# Patient Record
Sex: Male | Born: 1995 | Race: White | Hispanic: No | Marital: Single | Smoking: Never smoker
Health system: Southern US, Community
[De-identification: ages and names within clinical notes are randomized; demographics above are authoritative.]

## PROBLEM LIST (undated history)

## (undated) DIAGNOSIS — J45909 Unspecified asthma, uncomplicated: Secondary | ICD-10-CM

## (undated) HISTORY — DX: Unspecified asthma, uncomplicated: J45.909

---
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
## 2022-05-05 ENCOUNTER — Encounter: Payer: Self-pay | Admitting: Medical

## 2022-05-05 ENCOUNTER — Ambulatory Visit: Payer: Self-pay | Admitting: Medical

## 2022-05-05 VITALS — BP 112/82 | HR 82 | Temp 97.0°F | Resp 16

## 2022-05-05 DIAGNOSIS — R051 Acute cough: Secondary | ICD-10-CM

## 2022-05-05 DIAGNOSIS — J988 Other specified respiratory disorders: Secondary | ICD-10-CM

## 2022-05-05 MED ORDER — BENZONATATE 100 MG PO CAPS: 100.0000 mg | ORAL_CAPSULE | Freq: Two times a day (BID) | ORAL | 0 refills | Status: DC | PRN

## 2022-05-05 MED ORDER — AMOXICILLIN-POT CLAVULANATE 875-125 MG PO TABS: 1.0000 | ORAL_TABLET | Freq: Two times a day (BID) | ORAL | 0 refills | Status: DC

## 2022-05-05 MED ORDER — ALBUTEROL SULFATE HFA 108 (90 BASE) MCG/ACT IN AERS: 2.0000 | INHALATION_SPRAY | Freq: Four times a day (QID) | RESPIRATORY_TRACT | 2 refills | Status: DC | PRN

## 2022-05-05 NOTE — Progress Notes (Signed)
   Subjective:    Patient ID: Max Blankenship, male    DOB: 1996-07-17, 26 y.o.   MRN: [id number]  HPI  26 yo male in non acute distress , presents today with productive yellow cough. Denies fever or chill or SOB or CP.   Review of Systems  Respiratory:  Positive for cough.        Objective:   Physical Exam Vitals and nursing note reviewed.  Constitutional:      Appearance: Normal appearance.  HENT:     Head: Normocephalic and atraumatic.     Right Ear: Tympanic membrane, ear canal and external ear normal.     Left Ear: Tympanic membrane, ear canal and external ear normal.     Mouth/Throat:     Mouth: Mucous membranes are moist.     Pharynx: Oropharynx is clear.  Eyes:     Extraocular Movements: Extraocular movements intact.     Conjunctiva/sclera: Conjunctivae normal.     Pupils: Pupils are equal, round, and reactive to light.  Cardiovascular:     Rate and Rhythm: Normal rate and regular rhythm.     Pulses: Normal pulses.     Heart sounds: Normal heart sounds.  Pulmonary:     Effort: Pulmonary effort is normal.     Breath sounds: Wheezing (upper airway) present.  Musculoskeletal:        General: Normal range of motion.     Cervical back: Normal range of motion and neck supple.  Skin:    General: Skin is warm and dry.     Capillary Refill: Capillary refill takes less than 2 seconds.  Neurological:     General: No focal deficit present.     Mental Status: He is alert and oriented to person, place, and time.  Psychiatric:        Mood and Affect: Mood normal.        Behavior: Behavior normal.        Thought Content: Thought content normal.        Judgment: Judgment normal.           Assessment & Plan:   Encounter Diagnoses  Name Primary?   Bacterial respiratory infection Yes   Acute cough    Meds ordered this encounter  Medications   amoxicillin-clavulanate (AUGMENTIN) 875-125 MG tablet    Sig: Take 1 tablet by mouth 2 (two) times daily.    Dispense:   20 tablet    Refill:  0   albuterol (VENTOLIN HFA) 108 (90 Base) MCG/ACT inhaler    Sig: Inhale 2 puffs into the lungs every 6 (six) hours as needed for wheezing or shortness of breath.    Dispense:  8 g    Refill:  2   benzonatate (TESSALON) 100 MG capsule    Sig: Take 1 capsule (100 mg total) by mouth 2 (two) times daily as needed for cough.    Dispense:  30 capsule    Refill:  0   To rinse mouth after using Advair. Return in 3-5 days if not improving. Patient verbalizes understanding and has no questions at discharge.

## 2022-05-05 NOTE — Patient Instructions (Signed)
Cough, Adult A cough helps to clear your throat and lungs. A cough may be a sign of an illness or another medical condition. An acute cough may only last 2-3 weeks, while a chronic cough may last 8 or more weeks. Many things can cause a cough. They include: Germs (viruses or bacteria) that attack the airway. Breathing in things that bother (irritate) your lungs. Allergies. Asthma. Mucus that runs down the back of your throat (postnasal drip). Smoking. Acid backing up from the stomach into the tube that moves food from the mouth to the stomach (gastroesophageal reflux). Some medicines. Lung problems. Other medical conditions, such as heart failure or a blood clot in the lung (pulmonary embolism). Follow these instructions at home: Medicines Take over-the-counter and prescription medicines only as told by your doctor. Talk with your doctor before you take medicines that stop a cough (cough suppressants). Lifestyle  Do not smoke, and try not to be around smoke. Do not use any products that contain nicotine or tobacco, such as cigarettes, e-cigarettes, and chewing tobacco. If you need help quitting, ask your doctor. Drink enough fluid to keep your pee (urine) pale yellow. Avoid caffeine. Do not drink alcohol if your doctor tells you not to drink. General instructions  Watch for any changes in your cough. Tell your doctor about them. Always cover your mouth when you cough. Stay away from things that make you cough, such as perfume, candles, campfire smoke, or cleaning products. If the air is dry, use a cool mist vaporizer or humidifier in your home. If your cough is worse at night, try using extra pillows to raise your head up higher while you sleep. Rest as needed. Keep all follow-up visits as told by your doctor. This is important. Contact a doctor if: You have new symptoms. You cough up pus. Your cough does not get better after 2-3 weeks, or your cough gets worse. Cough medicine  does not help your cough and you are not sleeping well. You have pain that gets worse or pain that is not helped with medicine. You have a fever. You are losing weight and you do not know why. You have night sweats. Get help right away if: You cough up blood. You have trouble breathing. Your heartbeat is very fast. These symptoms may be an emergency. Do not wait to see if the symptoms will go away. Get medical help right away. Call your local emergency services (911 in the [place] Do not drive yourself to the hospital. Summary A cough helps to clear your throat and lungs. Many things can cause a cough. Take over-the-counter and prescription medicines only as told by your doctor. Always cover your mouth when you cough. Contact a doctor if you have new symptoms or you have a cough that does not get better or gets worse. This information is not intended to replace advice given to you by your health care provider. Make sure you discuss any questions you have with your health care provider. Document Revised: 12/02/2019 Document Reviewed: 11/01/2018 Elsevier Patient Education  2023 Elsevier Inc. Upper Respiratory Infection, Adult An upper respiratory infection (URI) affects the nose, throat, and upper airways that lead to the lungs. The most common type of URI is often called the common cold. URIs usually get better on their own, without medical treatment. What are the causes? A URI is caused by a germ (virus). You may catch these germs by: Breathing in droplets from an infected person's cough or sneeze. Touching something that  has the germ on it (is contaminated) and then touching your mouth, nose, or eyes. What increases the risk? You are more likely to get a URI if: You are very young or very old. You have close contact with others, such as at work, school, or a health care facility. You smoke. You have long-term (chronic) heart or lung disease. You have a weakened disease-fighting system  (immune system). You have nasal allergies or asthma. You have a lot of stress. You have poor nutrition. What are the signs or symptoms? Runny or stuffy (congested) nose. Cough. Sneezing. Sore throat. Headache. Feeling tired (fatigue). Fever. Not wanting to eat as much as usual. Pain in your forehead, behind your eyes, and over your cheekbones (sinus pain). Muscle aches. Redness or irritation of the eyes. Pressure in the ears or face. How is this treated? URIs usually get better on their own within 7-10 days. Medicines cannot cure URIs, but your doctor may recommend certain medicines to help relieve symptoms, such as: Over-the-counter cold medicines. Medicines to reduce coughing (cough suppressants). Coughing is a type of defense against infection that helps to clear the nose, throat, windpipe, and lungs (respiratory system). Take these medicines only as told by your doctor. Medicines to lower your fever. Follow these instructions at home: Activity Rest as needed. If you have a fever, stay home from work or school until your fever is gone, or until your doctor says you may return to work or school. You should stay home until you cannot spread the infection anymore (you are not contagious). Your doctor may have you wear a face mask so you have less risk of spreading the infection. Relieving symptoms Rinse your mouth often with salt water. To make salt water, dissolve -1 tsp (3-6 g) of salt in 1 cup (237 mL) of warm water. Use a cool-mist humidifier to add moisture to the air. This can help you breathe more easily. Eating and drinking  Drink enough fluid to keep your pee (urine) pale yellow. Eat soups and other clear broths. General instructions  Take over-the-counter and prescription medicines only as told by your doctor. Do not smoke or use any products that contain nicotine or tobacco. If you need help quitting, ask your doctor. Avoid being where people are smoking (avoid  secondhand smoke). Stay up to date on all your shots (immunizations), and get the flu shot every year. Keep all follow-up visits. How to prevent the spread of infection to others  Wash your hands with soap and water for at least 20 seconds. If you cannot use soap and water, use hand sanitizer. Avoid touching your mouth, face, eyes, or nose. Cough or sneeze into a tissue or your sleeve or elbow. Do not cough or sneeze into your hand or into the air. Contact a doctor if: You are getting worse, not better. You have any of these: A fever or chills. Brown or red mucus in your nose. Yellow or brown fluid (discharge)coming from your nose. Pain in your face, especially when you bend forward. Swollen neck glands. Pain when you swallow. White areas in the back of your throat. Get help right away if: You have shortness of breath that gets worse. You have very bad or constant: Headache. Ear pain. Pain in your forehead, behind your eyes, and over your cheekbones (sinus pain). Chest pain. You have long-lasting (chronic) lung disease along with any of these: Making high-pitched whistling sounds when you breathe, most often when you breathe out (wheezing). Long-lasting cough (more   than 14 days). Coughing up blood. A change in your usual mucus. You have a stiff neck. You have changes in your: Vision. Hearing. Thinking. Mood. These symptoms may be an emergency. Get help right away. Call 911. Do not wait to see if the symptoms will go away. Do not drive yourself to the hospital. Summary An upper respiratory infection (URI) is caused by a germ (virus). The most common type of URI is often called the common cold. URIs usually get better within 7-10 days. Take over-the-counter and prescription medicines only as told by your doctor. This information is not intended to replace advice given to you by your health care provider. Make sure you discuss any questions you have with your health care  provider. Document Revised: 05/15/2021 Document Reviewed: 05/15/2021 Elsevier Patient Education  2023 [employer].

## 2022-12-30 ENCOUNTER — Ambulatory Visit: Payer: BC Managed Care – PPO | Admitting: Family Medicine

## 2022-12-30 ENCOUNTER — Encounter: Payer: Self-pay | Admitting: Family Medicine

## 2022-12-30 VITALS — BP 100/78 | HR 78 | Ht 69.0 in | Wt 198.4 lb

## 2022-12-30 DIAGNOSIS — Z23 Encounter for immunization: Secondary | ICD-10-CM | POA: Diagnosis not present

## 2022-12-30 DIAGNOSIS — J45909 Unspecified asthma, uncomplicated: Secondary | ICD-10-CM | POA: Diagnosis not present

## 2022-12-30 DIAGNOSIS — Z7689 Persons encountering health services in other specified circumstances: Secondary | ICD-10-CM | POA: Diagnosis not present

## 2022-12-30 NOTE — Assessment & Plan Note (Signed)
Tdap administered today.

## 2022-12-30 NOTE — Progress Notes (Signed)
     Primary Care / Sports Medicine Office Visit  Patient Information:  Patient ID: Max Blankenship, male DOB: Apr 21, 1996 Age: 27 y.o. MRN: [id number]   Max Blankenship is a pleasant 27 y.o. male presenting with the following:  Chief Complaint  Patient presents with   Establish Care    Vitals:   12/30/22 1333  BP: 100/78  Pulse: 78  SpO2: 98%   Vitals:   12/30/22 1333  Weight: 198 lb 6.4 oz (90 kg)  Height: '5\' 9"'$  (1.753 m)   Body mass index is 29.3 kg/m.  No results found.   Independent interpretation of notes and tests performed by another provider:   None  Procedures performed:   None  Pertinent History, Exam, Impression, and Recommendations:   Max Blankenship was seen today for establish care.  Need for Tdap vaccination Assessment & Plan: Tdap administered today.  Orders: -     Tdap vaccine greater than or equal to 7yo IM  Encounter to establish care with new doctor Assessment & Plan: Establishing care, no active complaints.  Pertinent past medical, surgical, and family histories reviewed.  Examination with positive S1-S2, regular rate and rhythm, no additional heart sounds, clear lung fields throughout without wheezes, rales, rhonchi, abdomen soft, nontender, nondistended, normoactive bowel sounds, no hepatosplenomegaly.  At this stage in addition to discussion about general health we will coordinate follow-up where we will obtain risk stratification of labs as part of his annual physical.   Uncomplicated asthma, unspecified asthma severity, unspecified whether persistent Overview: -  pfts 12/04/14 FEV1 3.24 (75%) ratio 75 and no sign resp to saba/ nl dlco - Spirometry 12/07/14  3.31 (86%) Ratio 78  -  CPST  12/21/14 >nl with neg EIA  Assessment & Plan: Excellent symptom control, no exacerbations for several years.    I provided a total time of 46 minutes including both face-to-face and non-face-to-face time on 12/30/2022 inclusive of time utilized for  medical chart review, information gathering, care coordination with staff, and documentation completion.   Orders & Medications No orders of the defined types were placed in this encounter.  Orders Placed This Encounter  Procedures   Tdap vaccine greater than or equal to 7yo IM     Return in about 4 weeks (around 01/27/2023) for CPE.     Montel Culver, MD, [facility]   Primary Care Sports Medicine Primary Care and Sports Medicine at [facility]

## 2022-12-30 NOTE — Patient Instructions (Signed)
-   Return for annual physical

## 2022-12-30 NOTE — Assessment & Plan Note (Signed)
Establishing care, no active complaints.  Pertinent past medical, surgical, and family histories reviewed.  Examination with positive S1-S2, regular rate and rhythm, no additional heart sounds, clear lung fields throughout without wheezes, rales, rhonchi, abdomen soft, nontender, nondistended, normoactive bowel sounds, no hepatosplenomegaly.  At this stage in addition to discussion about general health we will coordinate follow-up where we will obtain risk stratification of labs as part of his annual physical.

## 2022-12-30 NOTE — Assessment & Plan Note (Signed)
Excellent symptom control, no exacerbations for several years.

## 2023-01-27 ENCOUNTER — Encounter: Payer: Self-pay | Admitting: Family Medicine

## 2023-01-27 ENCOUNTER — Ambulatory Visit (INDEPENDENT_AMBULATORY_CARE_PROVIDER_SITE_OTHER): Payer: BC Managed Care – PPO | Admitting: Family Medicine

## 2023-01-27 VITALS — BP 122/82 | HR 80 | Ht 69.0 in | Wt 197.0 lb

## 2023-01-27 DIAGNOSIS — J4520 Mild intermittent asthma, uncomplicated: Secondary | ICD-10-CM

## 2023-01-27 DIAGNOSIS — E559 Vitamin D deficiency, unspecified: Secondary | ICD-10-CM

## 2023-01-27 DIAGNOSIS — Z1159 Encounter for screening for other viral diseases: Secondary | ICD-10-CM

## 2023-01-27 DIAGNOSIS — Z114 Encounter for screening for human immunodeficiency virus [HIV]: Secondary | ICD-10-CM

## 2023-01-27 DIAGNOSIS — Z0000 Encounter for general adult medical examination without abnormal findings: Secondary | ICD-10-CM | POA: Diagnosis not present

## 2023-01-27 DIAGNOSIS — Z13220 Encounter for screening for lipoid disorders: Secondary | ICD-10-CM

## 2023-01-27 DIAGNOSIS — J3089 Other allergic rhinitis: Secondary | ICD-10-CM

## 2023-01-27 DIAGNOSIS — J309 Allergic rhinitis, unspecified: Secondary | ICD-10-CM | POA: Insufficient documentation

## 2023-01-27 NOTE — Progress Notes (Signed)
Annual Physical Exam Visit  Patient Information:  Patient ID: Max Blankenship, male DOB: 1996/05/26 Age: 27 y.o. MRN: [id number]   Subjective:   CC: Annual Physical Exam  HPI:  Max Blankenship is here for their annual physical.  I reviewed the past medical history, family history, social history, surgical history, and allergies today and changes were made as necessary.  Please see the problem list section below for additional details.  Past Medical History: Past Medical History:  Diagnosis Date   Asthma 2007   well controlled   Past Surgical History: Past Surgical History:  Procedure Laterality Date   LUMBAR MICRODISCECTOMY  2017   Progressive back pain, did not respond to PT, 2 levels   Family History: Family History  Problem Relation Age of Onset   Anxiety disorder Mother    Depression Mother    Hypertension Father 79 - 74   Liver disease Brother 59       Fatty LIver   Diabetes Maternal Grandmother    Pancreatic cancer Maternal Grandmother    Diabetes Maternal Grandfather    Heart attack Maternal Grandfather    Diabetes Paternal Grandmother    Diabetes Paternal Grandfather    Allergies: Allergies  Allergen Reactions   Naproxen Sodium Hives and Swelling   Health Maintenance: Health Maintenance  Topic Date Due   COVID-19 Vaccine (1) Never done   HPV VACCINES (1 - Male 2-dose series) Never done   HIV Screening  Never done   Hepatitis C Screening  Never done   INFLUENZA VACCINE  05/28/2023   DTaP/Tdap/Td (2 - Td or Tdap) 12/29/2032    HM Colonoscopy     This patient has no relevant Health Maintenance data.      Medications: No current outpatient medications on file prior to visit.   No current facility-administered medications on file prior to visit.    Review of Systems: No headache, visual changes, nausea, vomiting, diarrhea, constipation, dizziness, abdominal pain, skin rash, fevers, chills, night sweats, swollen lymph nodes, weight loss,  chest pain, body aches, joint swelling, muscle aches, shortness of breath, mood changes, visual or auditory hallucinations reported.  Objective:   Vitals:   01/27/23 1327  BP: 122/82  Pulse: 80  SpO2: 97%   Vitals:   01/27/23 1327  Weight: 197 lb (89.4 kg)  Height: 5\' 9"  (1.753 m)   Body mass index is 29.09 kg/m.  General: Well Developed, well nourished, and in no acute distress.  Neuro: Alert and oriented x3, extra-ocular muscles intact, sensation grossly intact. Cranial nerves II through XII are grossly intact, motor, sensory, and coordinative functions are intact. HEENT: Normocephalic, atraumatic, pupils equal round reactive to light, neck supple, no masses, no lymphadenopathy, thyroid nonpalpable. Oropharynx, nasopharynx with mildly erythematous turbinates bilaterally, external ear canals are unremarkable. Skin: Warm and dry, no rashes noted.  Cardiac: Regular rate and rhythm, no murmurs rubs or gallops. No peripheral edema. Pulses symmetric. Respiratory: Clear to auscultation bilaterally. Not using accessory muscles, speaking in full sentences.  Abdominal: Soft, nontender, nondistended, positive bowel sounds, no masses, no organomegaly. Musculoskeletal: Shoulder, elbow, wrist, hip, knee, ankle stable, and with full range of motion.    Impression and Recommendations:   The patient was counselled, risk factors were discussed, and anticipatory guidance given.  Problem List Items Addressed This Visit       Respiratory   Allergic rhinitis due to allergen    Noted seasonally, currently somewhat controlled with sporadic usage of Claritin 24-hour.  -  Dose Claritin consistently throughout the allergy season - Did recommend alternative or adjunct Flonase      Asthma    Asymptomatic.        Other   Annual physical exam - Primary    Annual examination completed, risk stratification labs ordered, anticipatory guidance provided.  We will follow labs once resulted.       Relevant Orders   CBC   Comprehensive metabolic panel   Hepatitis C antibody   HIV Antibody (routine testing w rflx)   Lipid panel   TSH   VITAMIN D 25 Hydroxy (Vit-D Deficiency, Fractures)   Other Visit Diagnoses     Screening for HIV (human immunodeficiency virus)       Relevant Orders   HIV Antibody (routine testing w rflx)   Need for hepatitis C screening test       Relevant Orders   Hepatitis C antibody   Screening for lipoid disorders       Relevant Orders   Lipid panel   Vitamin D deficiency       Relevant Orders   VITAMIN D 25 Hydroxy (Vit-D Deficiency, Fractures)   Healthcare maintenance       Relevant Orders   CBC   Comprehensive metabolic panel   Hepatitis C antibody   HIV Antibody (routine testing w rflx)   Lipid panel   TSH   VITAMIN D 25 Hydroxy (Vit-D Deficiency, Fractures)        Orders & Medications Medications: No orders of the defined types were placed in this encounter.  Orders Placed This Encounter  Procedures   CBC   Comprehensive metabolic panel   Hepatitis C antibody   HIV Antibody (routine testing w rflx)   Lipid panel   TSH   VITAMIN D 25 Hydroxy (Vit-D Deficiency, Fractures)     Return in about 1 year (around 01/27/2024) for CPE.    Montel Culver, MD, [facility]   Primary Care Sports Medicine Primary Care and Sports Medicine at [facility]

## 2023-01-27 NOTE — Patient Instructions (Signed)
-   Obtain fasting labs with orders provided (can have water or black coffee but otherwise no food or drink x 8 hours before labs) °- Review information provided °- Attend eye doctor annually, dentist every 6 months, work towards or maintain 30 minutes of moderate intensity physical activity at least 5 days per week, and consume a balanced diet °- Return in 1 year for physical °- Contact us for any questions between now and then °

## 2023-01-27 NOTE — Assessment & Plan Note (Signed)
Annual examination completed, risk stratification labs ordered, anticipatory guidance provided.  We will follow labs once resulted. 

## 2023-01-27 NOTE — Addendum Note (Signed)
Addended by: Montel Culver on: 01/27/2023 02:08 PM   Modules accepted: Orders

## 2023-01-27 NOTE — Assessment & Plan Note (Signed)
Asymptomatic. 

## 2023-01-27 NOTE — Assessment & Plan Note (Signed)
Noted seasonally, currently somewhat controlled with sporadic usage of Claritin 24-hour.  - Dose Claritin consistently throughout the allergy season - Did recommend alternative or adjunct Flonase

## 2023-01-28 LAB — COMPREHENSIVE METABOLIC PANEL
ALT: 215 IU/L — ABNORMAL HIGH (ref 0–44)
AST: 70 IU/L — ABNORMAL HIGH (ref 0–40)
Albumin/Globulin Ratio: 1.7 (ref 1.2–2.2)
Albumin: 4.6 g/dL (ref 4.3–5.2)
Alkaline Phosphatase: 69 IU/L (ref 44–121)
BUN/Creatinine Ratio: 9 (ref 9–20)
BUN: 9 mg/dL (ref 6–20)
Bilirubin Total: 0.6 mg/dL (ref 0.0–1.2)
CO2: 25 mmol/L (ref 20–29)
Calcium: 9.8 mg/dL (ref 8.7–10.2)
Chloride: 102 mmol/L (ref 96–106)
Creatinine, Ser: 1.03 mg/dL (ref 0.76–1.27)
Globulin, Total: 2.7 g/dL (ref 1.5–4.5)
Glucose: 74 mg/dL (ref 70–99)
Potassium: 4.6 mmol/L (ref 3.5–5.2)
Sodium: 139 mmol/L (ref 134–144)
Total Protein: 7.3 g/dL (ref 6.0–8.5)
eGFR: 103 mL/min/{1.73_m2} (ref >59)

## 2023-01-28 LAB — HEPATITIS C ANTIBODY: Hep C Virus Ab: NONREACTIVE

## 2023-01-28 LAB — APO A1 + B + RATIO
Apolipo. B/A-1 Ratio: 1 ratio — ABNORMAL HIGH (ref 0.0–0.7)
Apolipoprotein A-1: 129 mg/dL (ref 101–178)
Apolipoprotein B: 126 mg/dL — ABNORMAL HIGH (ref <90)

## 2023-01-28 LAB — LIPID PANEL
Chol/HDL Ratio: 6 ratio — ABNORMAL HIGH (ref 0.0–5.0)
Cholesterol, Total: 254 mg/dL — ABNORMAL HIGH (ref 100–199)
HDL: 42 mg/dL (ref >39)
LDL Chol Calc (NIH): 173 mg/dL — ABNORMAL HIGH (ref 0–99)
Triglycerides: 210 mg/dL — ABNORMAL HIGH (ref 0–149)
VLDL Cholesterol Cal: 39 mg/dL (ref 5–40)

## 2023-01-28 LAB — HIV ANTIBODY (ROUTINE TESTING W REFLEX): HIV Screen 4th Generation wRfx: NONREACTIVE

## 2023-01-28 LAB — CBC
Hematocrit: 43.6 % (ref 37.5–51.0)
Hemoglobin: 15.2 g/dL (ref 13.0–17.7)
MCH: 31.9 pg (ref 26.6–33.0)
MCHC: 34.9 g/dL (ref 31.5–35.7)
MCV: 91 fL (ref 79–97)
Platelets: 229 10*3/uL (ref 150–450)
RBC: 4.77 x10E6/uL (ref 4.14–5.80)
RDW: 11.9 % (ref 11.6–15.4)
WBC: 9.8 10*3/uL (ref 3.4–10.8)

## 2023-01-28 LAB — VITAMIN D 25 HYDROXY (VIT D DEFICIENCY, FRACTURES): Vit D, 25-Hydroxy: 14.6 ng/mL — ABNORMAL LOW (ref 30.0–100.0)

## 2023-01-28 LAB — TSH: TSH: 1.21 u[IU]/mL (ref 0.450–4.500)

## 2023-02-03 ENCOUNTER — Other Ambulatory Visit: Payer: Self-pay | Admitting: Family Medicine

## 2023-02-03 ENCOUNTER — Encounter: Payer: Self-pay | Admitting: Family Medicine

## 2023-02-03 MED ORDER — VITAMIN D (ERGOCALCIFEROL) 1.25 MG (50000 UNIT) PO CAPS: 50000.0000 [IU] | ORAL_CAPSULE | ORAL | 0 refills | Status: DC

## 2023-02-06 ENCOUNTER — Other Ambulatory Visit: Payer: Self-pay

## 2023-02-06 DIAGNOSIS — Z13220 Encounter for screening for lipoid disorders: Secondary | ICD-10-CM

## 2023-02-06 NOTE — Progress Notes (Signed)
Labs order, pt appointment made for video follow up labs

## 2023-05-08 ENCOUNTER — Encounter: Payer: Self-pay | Admitting: Family Medicine

## 2023-05-08 ENCOUNTER — Telehealth (INDEPENDENT_AMBULATORY_CARE_PROVIDER_SITE_OTHER): Payer: BC Managed Care – PPO | Admitting: Family Medicine

## 2023-05-08 DIAGNOSIS — E782 Mixed hyperlipidemia: Secondary | ICD-10-CM | POA: Diagnosis not present

## 2023-05-08 LAB — COMPREHENSIVE METABOLIC PANEL
ALT: 215 IU/L — ABNORMAL HIGH (ref 0–44)
AST: 84 IU/L — ABNORMAL HIGH (ref 0–40)
Albumin: 4.7 g/dL (ref 4.3–5.2)
Alkaline Phosphatase: 68 IU/L (ref 44–121)
BUN/Creatinine Ratio: 7 — ABNORMAL LOW (ref 9–20)
BUN: 8 mg/dL (ref 6–20)
Bilirubin Total: 1 mg/dL (ref 0.0–1.2)
CO2: 23 mmol/L (ref 20–29)
Calcium: 10 mg/dL (ref 8.7–10.2)
Chloride: 99 mmol/L (ref 96–106)
Creatinine, Ser: 1.1 mg/dL (ref 0.76–1.27)
Globulin, Total: 2.4 g/dL (ref 1.5–4.5)
Glucose: 98 mg/dL (ref 70–99)
Potassium: 4.2 mmol/L (ref 3.5–5.2)
Sodium: 138 mmol/L (ref 134–144)
Total Protein: 7.1 g/dL (ref 6.0–8.5)
eGFR: 95 mL/min/{1.73_m2} (ref >59)

## 2023-05-08 LAB — LIPID PANEL
Chol/HDL Ratio: 7.7 ratio — ABNORMAL HIGH (ref 0.0–5.0)
Cholesterol, Total: 277 mg/dL — ABNORMAL HIGH (ref 100–199)
HDL: 36 mg/dL — ABNORMAL LOW (ref >39)
LDL Chol Calc (NIH): 207 mg/dL — ABNORMAL HIGH (ref 0–99)
Triglycerides: 175 mg/dL — ABNORMAL HIGH (ref 0–149)
VLDL Cholesterol Cal: 34 mg/dL (ref 5–40)

## 2023-05-08 LAB — APO A1 + B + RATIO
Apolipo. B/A-1 Ratio: 1.5 ratio — ABNORMAL HIGH (ref 0.0–0.7)
Apolipoprotein A-1: 104 mg/dL (ref 101–178)
Apolipoprotein B: 153 mg/dL — ABNORMAL HIGH (ref <90)

## 2023-05-08 MED ORDER — PITAVASTATIN CALCIUM 2 MG PO TABS
2.0000 mg | ORAL_TABLET | Freq: Every evening | ORAL | 2 refills | Status: AC
Start: 2023-05-08 — End: ?

## 2023-05-08 NOTE — Telephone Encounter (Signed)
l °

## 2023-05-08 NOTE — Telephone Encounter (Signed)
FYI

## 2023-05-09 LAB — SPECIMEN STATUS REPORT

## 2023-05-09 LAB — VITAMIN D 25 HYDROXY (VIT D DEFICIENCY, FRACTURES): Vit D, 25-Hydroxy: 29.3 ng/mL — ABNORMAL LOW (ref 30.0–100.0)

## 2023-05-11 DIAGNOSIS — E782 Mixed hyperlipidemia: Secondary | ICD-10-CM | POA: Insufficient documentation

## 2023-05-11 NOTE — Progress Notes (Signed)
Primary Care / Sports Medicine Virtual Visit  Patient Information:  Patient ID: Max Blankenship, male DOB: Oct 06, 1996 Age: 27 y.o. MRN: [id number]   Max Blankenship is a pleasant 28 y.o. male presenting with the following:  Chief Complaint  Patient presents with   lab follow up    Review of Systems: No fevers, chills, night sweats, weight loss, chest pain, or shortness of breath.   Patient Active Problem List   Diagnosis Date Noted   Mixed hyperlipidemia 05/11/2023   Annual physical exam 01/27/2023   Allergic rhinitis due to allergen 01/27/2023   Need for Tdap vaccination 12/30/2022   Asthma 12/07/2014   Past Medical History:  Diagnosis Date   Asthma 2007   well controlled   Outpatient Encounter Medications as of 05/08/2023  Medication Sig   Pitavastatin Calcium 2 MG TABS Take 1 tablet (2 mg total) by mouth every evening.   Vitamin D, Ergocalciferol, (DRISDOL) 1.25 MG (50000 UNIT) CAPS capsule Take 1 capsule (50,000 Units total) by mouth every 7 (seven) days. Take for 8 total doses(weeks)   No facility-administered encounter medications on file as of 05/08/2023.   Past Surgical History:  Procedure Laterality Date   LUMBAR MICRODISCECTOMY  2017   Progressive back pain, did not respond to PT, 2 levels    Virtual Visit via MyChart Video:   I connected with Max Blankenship on 05/11/23 via MyChart Video and verified that I am speaking with the correct person using appropriate identifiers.   The limitations, risks, security and privacy concerns of performing an evaluation and management service by MyChart Video, including the higher likelihood of inaccurate diagnoses and treatments, and the availability of in person appointments were reviewed. The possible need of an additional face-to-face encounter for complete and high quality delivery of care was discussed. The patient was also made aware that there may be a patient responsible charge related to this service. The  patient expressed understanding and wishes to proceed.  Provider location is in medical facility. Patient location is at their home, different from provider location. People involved in care of the patient during this telehealth encounter were myself, my nurse/medical assistant, and my front office/scheduling team member.  Objective findings:   General: Speaking full sentences, no audible heavy breathing. Sounds alert and appropriately interactive. Well-appearing. Face symmetric. Extraocular movements intact. Pupils equal and round. No nasal flaring or accessory muscle use visualized.  Independent interpretation of notes and tests performed by another provider:   None  Pertinent History, Exam, Impression, and Recommendations:   Mixed hyperlipidemia Noted on serial labs in the setting of lifestyle changes. We reviewed family history, persistent elevations, and treatment strategies to lower cardiovascular risk.   Plan as follows: - Start pitavastatin 2 mg  - Advance lifestyle changes - Recheck labs in 2 months   Orders & Medications Meds ordered this encounter  Medications   Pitavastatin Calcium 2 MG TABS    Sig: Take 1 tablet (2 mg total) by mouth every evening.    Dispense:  90 tablet    Refill:  2   Orders Placed This Encounter  Procedures   Apo A1 + B + Ratio   Comprehensive metabolic panel   Lipid panel     I discussed the above assessment and treatment plan with the patient. The patient was provided an opportunity to ask questions and all were answered. The patient agreed with the plan and demonstrated an understanding of the instructions.   The patient  was advised to call back or seek an in-person evaluation if the symptoms worsen or if the condition fails to improve as anticipated.   I provided a total time of 30 minutes including both face-to-face and non-face-to-face time on 05/11/2023 inclusive of time utilized for medical chart review, information gathering, care  coordination with staff, and documentation completion.    Jerrol Banana, MD, [facility]   Primary Care Sports Medicine Primary Care and Sports Medicine at [facility]

## 2023-05-11 NOTE — Patient Instructions (Addendum)
-   Start pitavastatin 2 mg  - Advance lifestyle changes - Recheck fasting labs in 2 months (stop by [facility] a few days prior to scheduled follow-up)

## 2023-05-11 NOTE — Assessment & Plan Note (Signed)
Noted on serial labs in the setting of lifestyle changes. We reviewed family history, persistent elevations, and treatment strategies to lower cardiovascular risk.   Plan as follows: - Start pitavastatin 2 mg  - Advance lifestyle changes - Recheck labs in 2 months

## 2023-07-14 ENCOUNTER — Telehealth: Payer: BC Managed Care – PPO | Admitting: Family Medicine

## 2023-07-16 ENCOUNTER — Telehealth: Payer: BC Managed Care – PPO | Admitting: Family Medicine

## 2023-07-30 ENCOUNTER — Telehealth: Payer: BC Managed Care – PPO | Admitting: Family Medicine

## 2023-07-30 ENCOUNTER — Encounter: Payer: Self-pay | Admitting: Family Medicine

## 2023-07-30 DIAGNOSIS — J45909 Unspecified asthma, uncomplicated: Secondary | ICD-10-CM

## 2023-07-30 DIAGNOSIS — J4520 Mild intermittent asthma, uncomplicated: Secondary | ICD-10-CM

## 2023-07-30 DIAGNOSIS — F17290 Nicotine dependence, other tobacco product, uncomplicated: Secondary | ICD-10-CM | POA: Diagnosis not present

## 2023-07-30 DIAGNOSIS — E782 Mixed hyperlipidemia: Secondary | ICD-10-CM

## 2023-07-30 LAB — COMPREHENSIVE METABOLIC PANEL
ALT: 120 [IU]/L — ABNORMAL HIGH (ref 0–44)
AST: 56 [IU]/L — ABNORMAL HIGH (ref 0–40)
Albumin: 5 g/dL (ref 4.3–5.2)
Alkaline Phosphatase: 75 [IU]/L (ref 44–121)
BUN/Creatinine Ratio: 10 (ref 9–20)
BUN: 10 mg/dL (ref 6–20)
Bilirubin Total: 1.2 mg/dL (ref 0.0–1.2)
CO2: 25 mmol/L (ref 20–29)
Calcium: 10.3 mg/dL — ABNORMAL HIGH (ref 8.7–10.2)
Chloride: 100 mmol/L (ref 96–106)
Creatinine, Ser: 0.98 mg/dL (ref 0.76–1.27)
Globulin, Total: 2.4 g/dL (ref 1.5–4.5)
Glucose: 76 mg/dL (ref 70–99)
Potassium: 5 mmol/L (ref 3.5–5.2)
Sodium: 140 mmol/L (ref 134–144)
Total Protein: 7.4 g/dL (ref 6.0–8.5)
eGFR: 109 mL/min/{1.73_m2} (ref >59)

## 2023-07-30 LAB — LIPID PANEL
Chol/HDL Ratio: 4.4 {ratio} (ref 0.0–5.0)
Cholesterol, Total: 176 mg/dL (ref 100–199)
HDL: 40 mg/dL (ref >39)
LDL Chol Calc (NIH): 107 mg/dL — ABNORMAL HIGH (ref 0–99)
Triglycerides: 162 mg/dL — ABNORMAL HIGH (ref 0–149)
VLDL Cholesterol Cal: 29 mg/dL (ref 5–40)

## 2023-07-30 LAB — APO A1 + B + RATIO
Apolipo. B/A-1 Ratio: 0.8 {ratio} — ABNORMAL HIGH (ref 0.0–0.7)
Apolipoprotein A-1: 120 mg/dL (ref 101–178)
Apolipoprotein B: 97 mg/dL — ABNORMAL HIGH (ref <90)

## 2023-07-30 MED ORDER — PITAVASTATIN CALCIUM 4 MG PO TABS
1.0000 | ORAL_TABLET | Freq: Every day | ORAL | 2 refills | Status: AC
Start: 2023-07-30 — End: ?

## 2023-07-30 MED ORDER — PITAVASTATIN CALCIUM 4 MG PO TABS
1.0000 | ORAL_TABLET | Freq: Every day | ORAL | 2 refills | Status: DC
Start: 2023-07-30 — End: 2023-07-30

## 2023-07-30 NOTE — Assessment & Plan Note (Signed)
Currently well controlled.  

## 2023-07-30 NOTE — Patient Instructions (Signed)
-   Continue healthy lifestyle changes from an activity and diet standpoint (see information attached and make further changes where applicable) - Increase to pitavastatin 4 mg daily (cholesterol medication) - Return for scheduled physical

## 2023-07-30 NOTE — Assessment & Plan Note (Signed)
Has been compliant with both lifestyle modifications as well as start of pitavastatin 2 mg without adverse effects reported.  We reviewed lipid and hepatic function trends.  Plan: - Continue healthy lifestyle changes from an activity and diet standpoint (see information attached and make further changes where applicable) - Increase to pitavastatin 4 mg daily (cholesterol medication) - Return for scheduled physical

## 2023-07-30 NOTE — Progress Notes (Signed)
Primary Care / Sports Medicine Virtual Visit  Patient Information:  Patient ID: Max Blankenship, male DOB: [birth year] Age: 27 y.o. MRN: [id number]   Max Blankenship is a pleasant 27 y.o. male presenting with the following:  Chief Complaint  Patient presents with   Follow-up    Review of Systems: No fevers, chills, night sweats, weight loss, chest pain, or shortness of breath.   Patient Active Problem List   Diagnosis Date Noted   Mixed hyperlipidemia 05/11/2023   Annual physical exam 01/27/2023   Allergic rhinitis due to allergen 01/27/2023   Need for Tdap vaccination 12/30/2022   Asthma 12/07/2014   Past Medical History:  Diagnosis Date   Asthma 2007   well controlled   Outpatient Encounter Medications as of 07/30/2023  Medication Sig   [DISCONTINUED] Pitavastatin Calcium 2 MG TABS Take 1 tablet (2 mg total) by mouth every evening.   [DISCONTINUED] Pitavastatin Calcium 4 MG TABS Take 1 tablet (4 mg total) by mouth daily.   [DISCONTINUED] Vitamin D, Ergocalciferol, (DRISDOL) 1.25 MG (50000 UNIT) CAPS capsule Take 1 capsule (50,000 Units total) by mouth every 7 (seven) days. Take for 8 total doses(weeks)   Pitavastatin Calcium 4 MG TABS Take 1 tablet (4 mg total) by mouth daily.   No facility-administered encounter medications on file as of 07/30/2023.   Past Surgical History:  Procedure Laterality Date   LUMBAR MICRODISCECTOMY  2017   Progressive back pain, did not respond to PT, 2 levels    Virtual Visit via MyChart Video:   I connected with Max Blankenship on 07/30/23 via MyChart Video and verified that I am speaking with the correct person using appropriate identifiers.   The limitations, risks, security and privacy concerns of performing an evaluation and management service by MyChart Video, including the higher likelihood of inaccurate diagnoses and treatments, and the availability of in person appointments were reviewed. The possible need of an  additional face-to-face encounter for complete and high quality delivery of care was discussed. The patient was also made aware that there may be a patient responsible charge related to this service. The patient expressed understanding and wishes to proceed.  Provider location is in medical facility. Patient location is at their home, different from provider location. People involved in care of the patient during this telehealth encounter were myself, my nurse/medical assistant, and my front office/scheduling team member.  Objective findings:   General: Speaking full sentences, no audible heavy breathing. Sounds alert and appropriately interactive. Well-appearing. Face symmetric. Extraocular movements intact. Pupils equal and round. No nasal flaring or accessory muscle use visualized.  Independent interpretation of notes and tests performed by another provider:   None  Pertinent History, Exam, Impression, and Recommendations:   Problem List Items Addressed This Visit       Respiratory   Asthma    Currently well-controlled        Other   Mixed hyperlipidemia - Primary    Has been compliant with both lifestyle modifications as well as start of pitavastatin 2 mg without adverse effects reported.  We reviewed lipid and hepatic function trends.  Plan: - Continue healthy lifestyle changes from an activity and diet standpoint (see information attached and make further changes where applicable) - Increase to pitavastatin 4 mg daily (cholesterol medication) - Return for scheduled physical      Relevant Medications   Pitavastatin Calcium 4 MG TABS     Orders & Medications Medications:  Meds ordered this encounter  Medications   DISCONTD: Pitavastatin Calcium 4 MG TABS    Sig: Take 1 tablet (4 mg total) by mouth daily.    Dispense:  90 tablet    Refill:  2    Failed prior statin with intolerable side effects. Use discount: [id number], [id number], PCN-CN, [id number]    Pitavastatin Calcium 4 MG TABS    Sig: Take 1 tablet (4 mg total) by mouth daily.    Dispense:  90 tablet    Refill:  2    Failed prior statin with intolerable side effects. Use discount: [id number], [id number], PCN-CN, [id number]   No orders of the defined types were placed in this encounter.    I discussed the above assessment and treatment plan with the patient. The patient was provided an opportunity to ask questions and all were answered. The patient agreed with the plan and demonstrated an understanding of the instructions.   The patient was advised to call back or seek an in-person evaluation if the symptoms worsen or if the condition fails to improve as anticipated.   I provided a total time of 30 minutes including both face-to-face and non-face-to-face time on 07/30/2023 inclusive of time utilized for medical chart review, information gathering, care coordination with staff, and documentation completion.    Jerrol Banana, MD, [facility]   Primary Care Sports Medicine Primary Care and Sports Medicine at [facility]

## 2024-01-28 ENCOUNTER — Encounter: Payer: Self-pay | Admitting: Family Medicine
